# Patient Record
Sex: Male | Born: 1945 | Race: White | Hispanic: No | Marital: Married | Smoking: Never smoker
Health system: Southern US, Community
[De-identification: ages and names within clinical notes are randomized; demographics above are authoritative.]

## PROBLEM LIST (undated history)

## (undated) DIAGNOSIS — I2510 Atherosclerotic heart disease of native coronary artery without angina pectoris: Secondary | ICD-10-CM

## (undated) DIAGNOSIS — E119 Type 2 diabetes mellitus without complications: Secondary | ICD-10-CM

## (undated) DIAGNOSIS — E785 Hyperlipidemia, unspecified: Secondary | ICD-10-CM

## (undated) DIAGNOSIS — I10 Essential (primary) hypertension: Secondary | ICD-10-CM

## (undated) DIAGNOSIS — I219 Acute myocardial infarction, unspecified: Secondary | ICD-10-CM

## (undated) DIAGNOSIS — N189 Chronic kidney disease, unspecified: Secondary | ICD-10-CM

## (undated) DIAGNOSIS — I209 Angina pectoris, unspecified: Secondary | ICD-10-CM

## (undated) DIAGNOSIS — N400 Enlarged prostate without lower urinary tract symptoms: Secondary | ICD-10-CM

## (undated) HISTORY — PX: HERNIA REPAIR: SHX51

## (undated) HISTORY — PX: COLONOSCOPY: SHX174

## (undated) HISTORY — PX: STENT PLACEMENT ILIAC (ARMC HX): HXRAD1735

## (undated) HISTORY — PX: CARDIAC CATHETERIZATION: SHX172

---

## 2006-03-24 ENCOUNTER — Ambulatory Visit: Payer: Self-pay | Admitting: Unknown Physician Specialty

## 2009-03-24 ENCOUNTER — Inpatient Hospital Stay: Payer: Self-pay | Admitting: Internal Medicine

## 2009-04-25 ENCOUNTER — Encounter: Payer: Self-pay | Admitting: Internal Medicine

## 2009-05-13 ENCOUNTER — Encounter: Payer: Self-pay | Admitting: Internal Medicine

## 2009-06-13 ENCOUNTER — Encounter: Payer: Self-pay | Admitting: Internal Medicine

## 2010-10-30 ENCOUNTER — Ambulatory Visit: Payer: Self-pay | Admitting: Urology

## 2010-11-02 ENCOUNTER — Ambulatory Visit: Payer: Self-pay | Admitting: Urology

## 2010-11-08 ENCOUNTER — Ambulatory Visit: Payer: Self-pay | Admitting: Urology

## 2010-12-10 ENCOUNTER — Ambulatory Visit: Payer: Self-pay | Admitting: Urology

## 2011-01-02 ENCOUNTER — Ambulatory Visit: Payer: Self-pay | Admitting: Urology

## 2011-01-09 ENCOUNTER — Ambulatory Visit: Payer: Self-pay | Admitting: Urology

## 2011-06-17 ENCOUNTER — Ambulatory Visit: Payer: Self-pay | Admitting: Internal Medicine

## 2012-02-01 IMAGING — CR DG ABDOMEN 1V
1 series · 1 of 1 positions shown · non-contrast
Comparison: none

REASON FOR EXAM: Nephrolithiasis
COMMENTS:

[view not recorded]
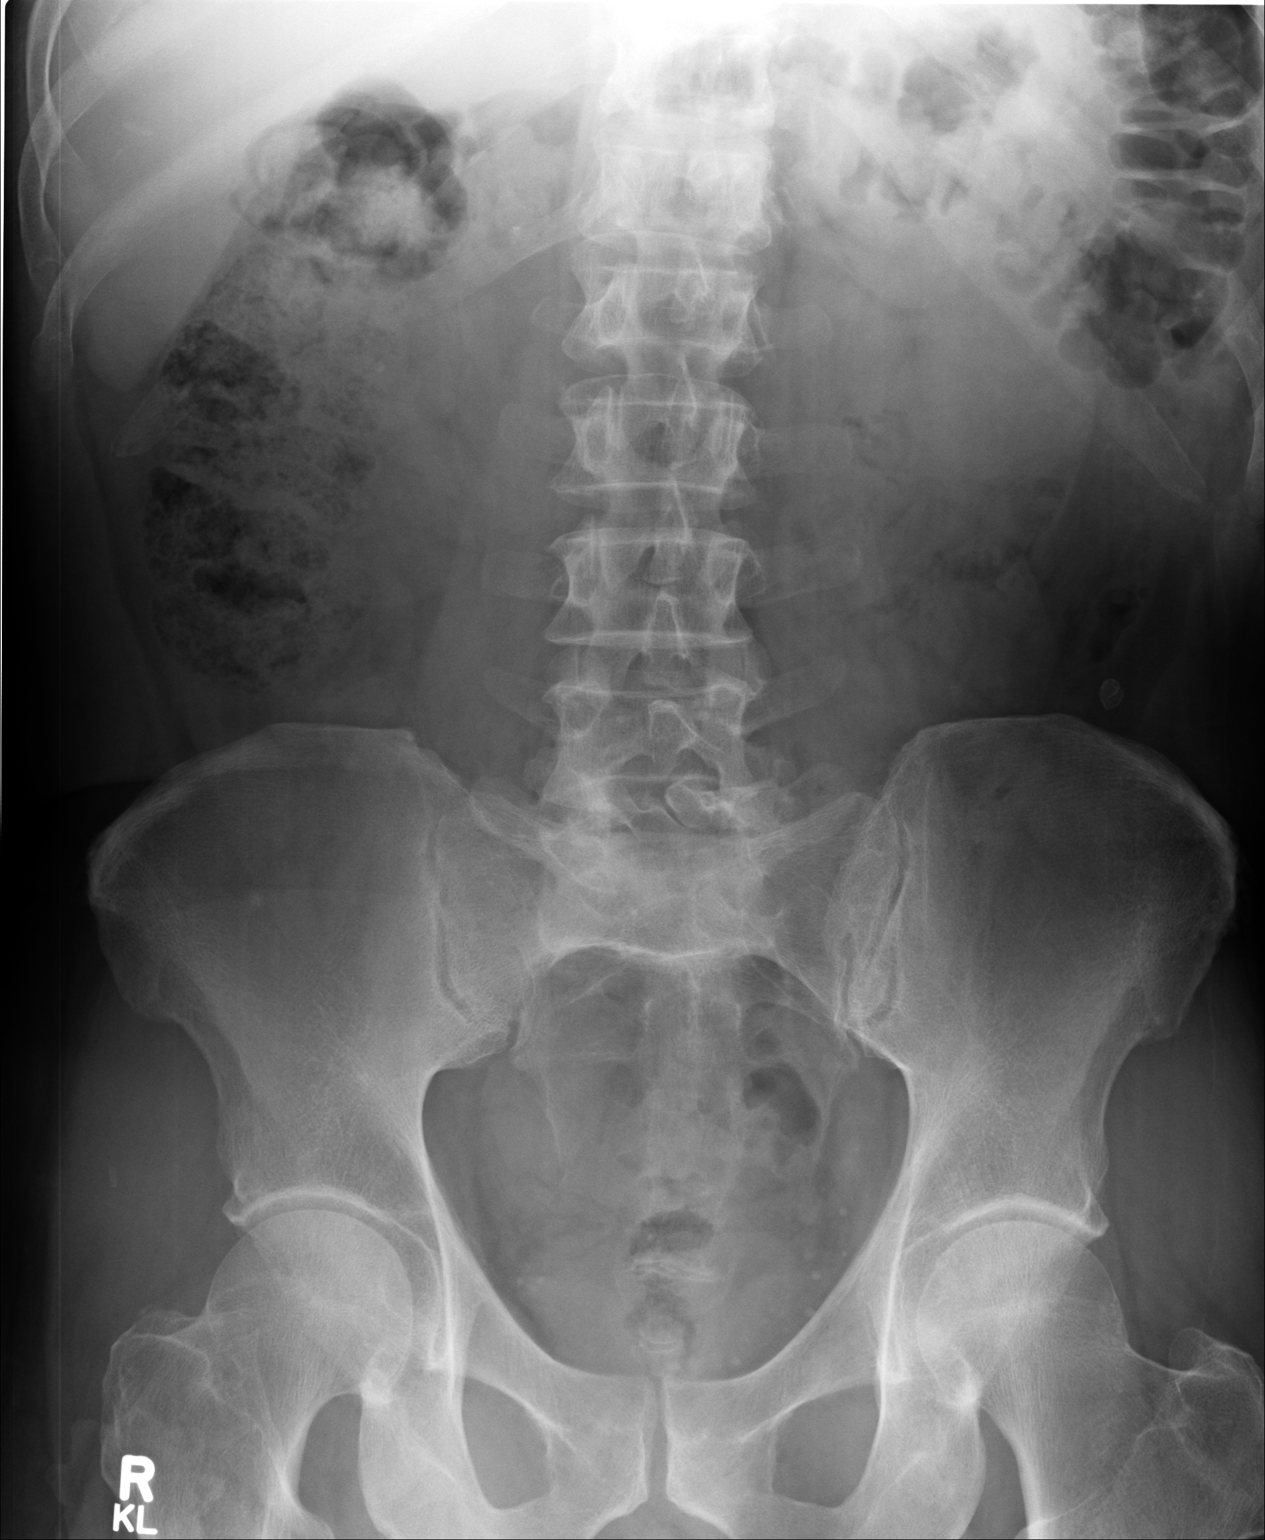

[1 of 1 positions shown; findings below may reference images not displayed]

PROCEDURE:     DXR - DXR KIDNEY URETER BLADDER  - January 02, 2011  [DATE]

RESULT:     Comparison is made to the prior exam of 12/10/2010. No definite
renal or ureteral calcifications are identified. Phleboliths are noted in
the pelvis bilaterally. The small right renal stone observed previously at
CT is not definitely seen, but may be present and obscured by overlying
bowel and bowel content.
IMPRESSION: 1.     No definite renal or ureteral calcifications are identified.

## 2012-02-08 IMAGING — US US RENAL KIDNEY
1 series · 17 of 25 positions shown · non-contrast
Comparison: none

REASON FOR EXAM: ureteral calculus and hydronephrosis
COMMENTS:

[Series 1: us renal kidney · 17 of 35 slices shown]
[im 1/35]
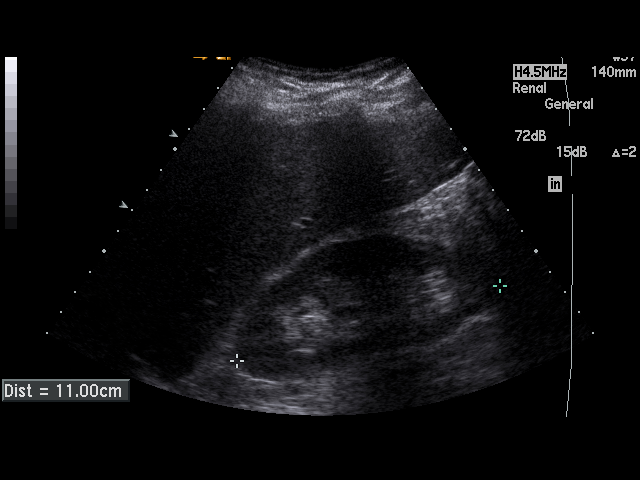
[im 3/35]
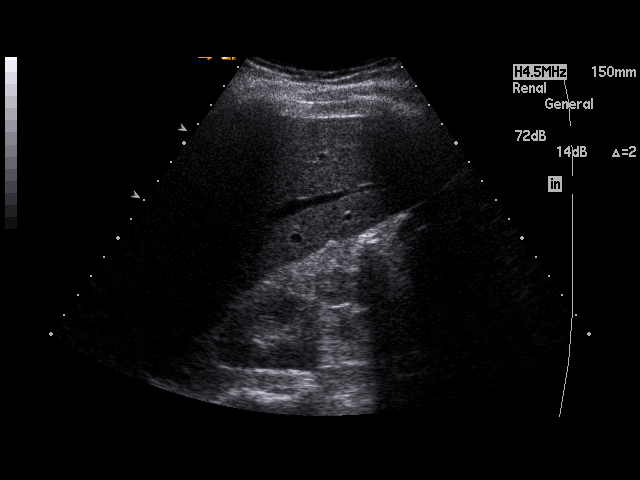
[im 5/35]
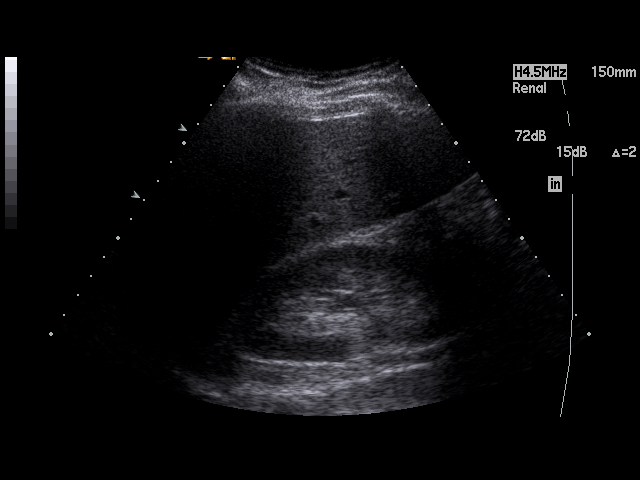
[im 8/35]
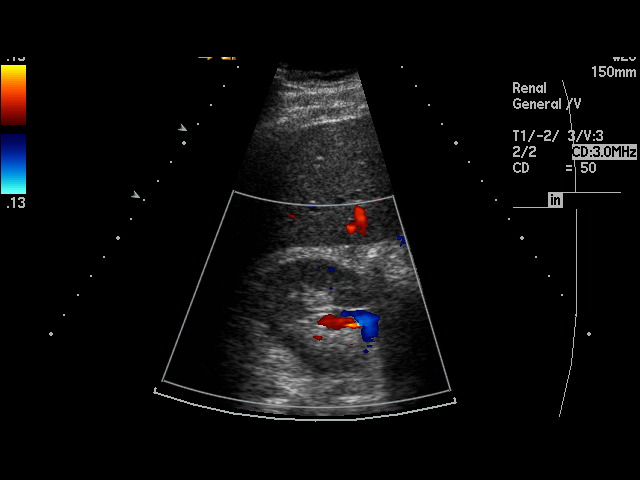
[im 9/35]
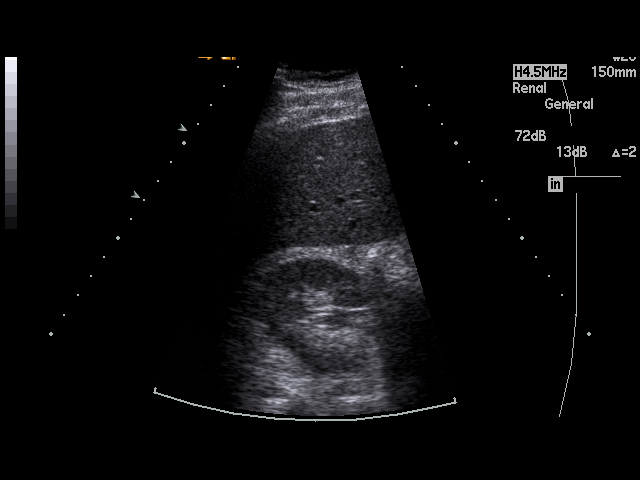
[im 12/35]
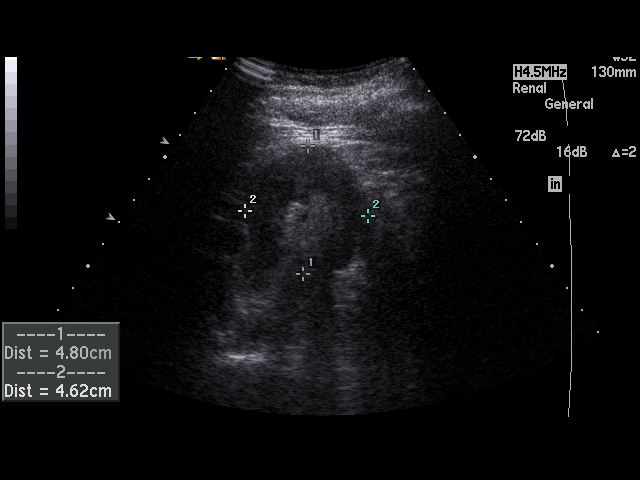
[im 13/35]
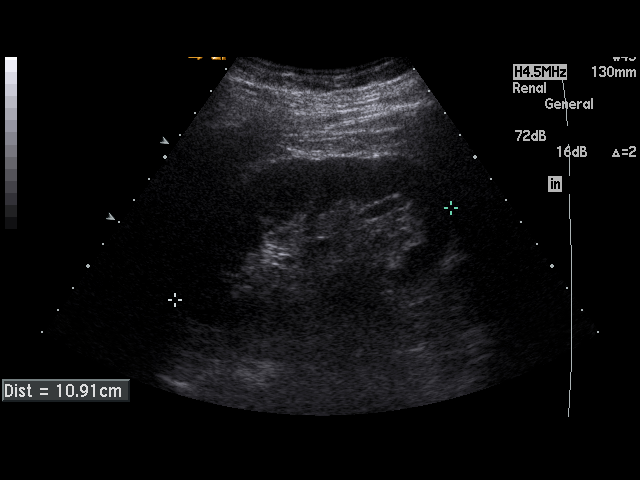
[im 16/35]
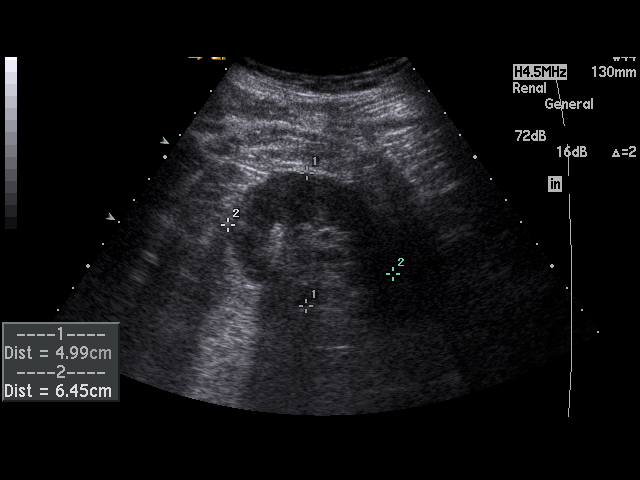
[im 18/35]
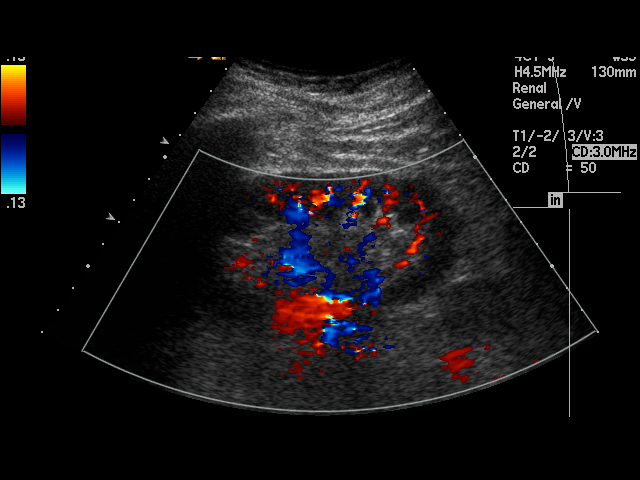
[im 19/35]
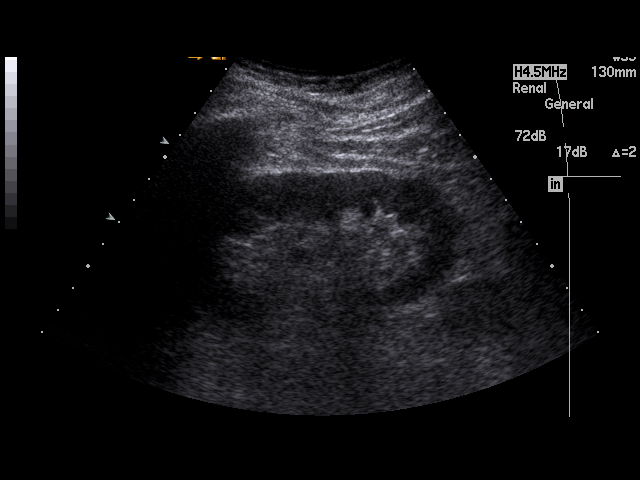
[im 22/35]
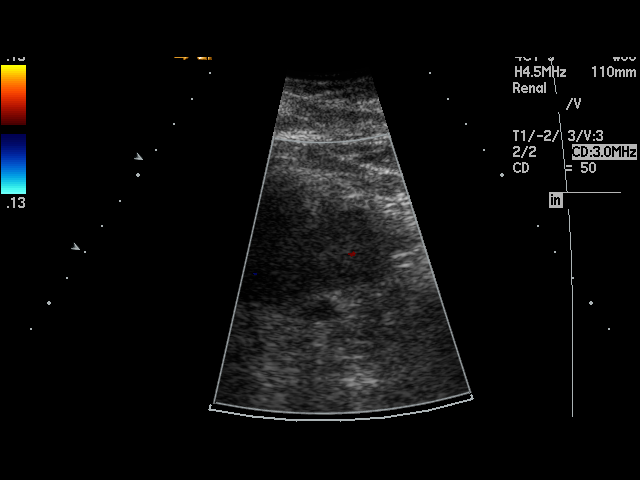
[im 23/35]
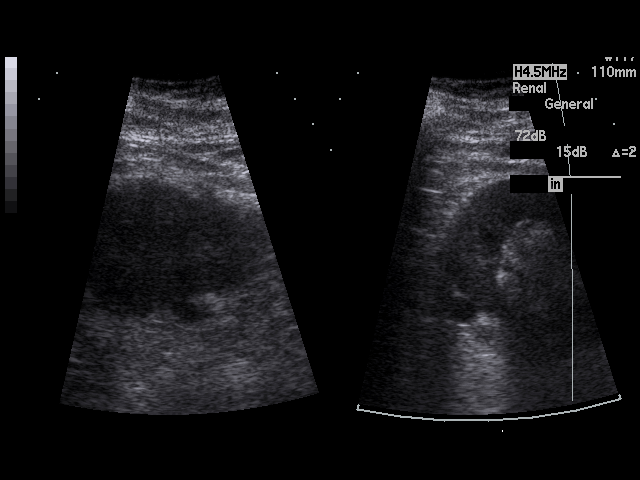
[im 26/35]
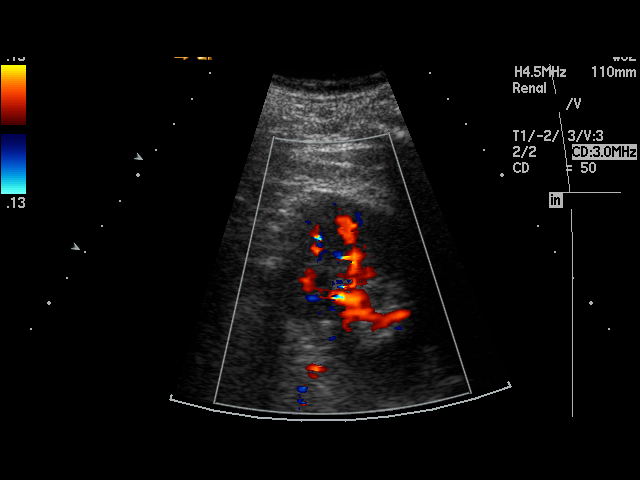
[im 27/35]
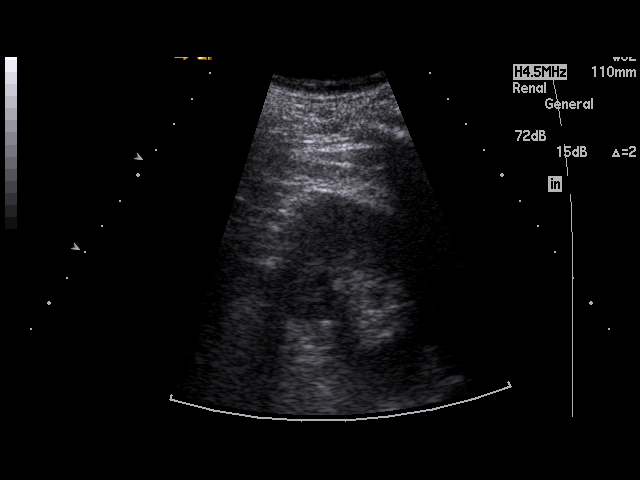
[im 30/35]
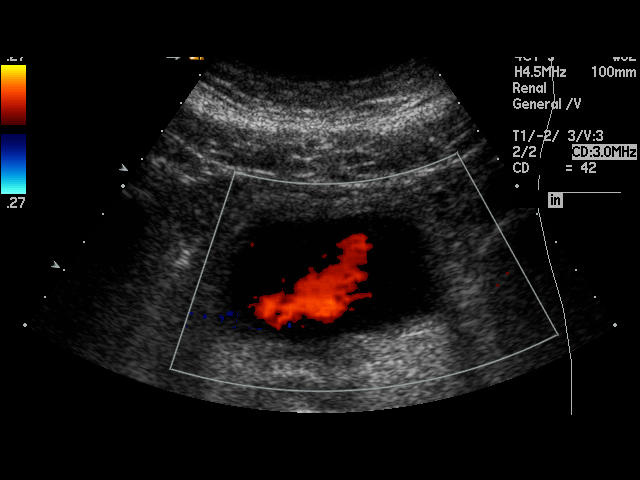
[im 32/35]
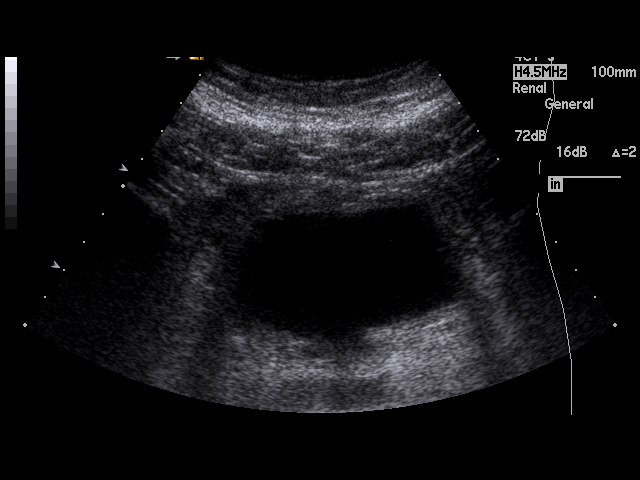
[im 35/35]
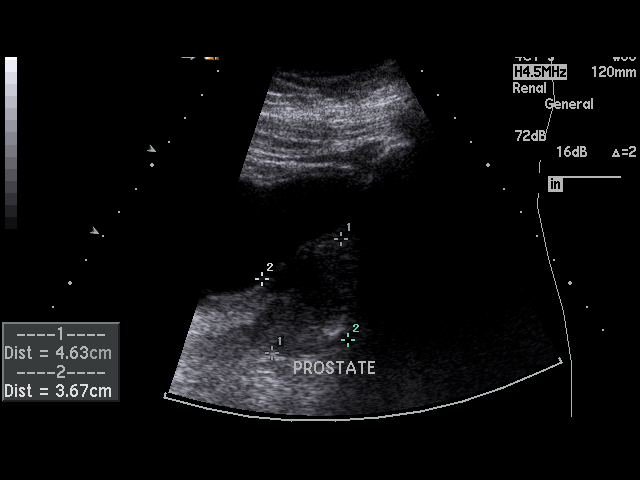

[17 of 25 positions shown; findings below may reference images not displayed]

PROCEDURE:     US  - US KIDNEY  - January 09, 2011  [DATE]

RESULT:     The right kidney measures 11.0 cm x 5.09 cm x 4.59 cm and the
left kidney measures 12.0 cm x 4.99 cm x 6.45 cm. The renal cortical margins
bilaterally are smooth. No solid renal mass lesions are seen. There is a
1.01 cm cyst of the left kidney. No renal calcifications are identified.
There is no hydronephrosis. The visualized portion of the urinary bladder is
normal in appearance. Bilateral ureteral flow jets are seen.
IMPRESSION: Normal study except for an incidentally noted left renal cyst.

## 2016-05-14 ENCOUNTER — Encounter: Payer: Self-pay | Admitting: *Deleted

## 2016-05-15 ENCOUNTER — Ambulatory Visit
Admission: RE | Admit: 2016-05-15 | Discharge: 2016-05-15 | Disposition: A | Discharge status: Home / Self Care | Payer: Medicare Other | Source: Ambulatory Visit | Attending: Unknown Physician Specialty | Admitting: Unknown Physician Specialty

## 2016-05-15 ENCOUNTER — Ambulatory Visit: Payer: Medicare Other | Admitting: Certified Registered"

## 2016-05-15 ENCOUNTER — Encounter
Admission: RE | Disposition: A | Discharge status: Home / Self Care | Payer: Self-pay | Source: Ambulatory Visit | Attending: Unknown Physician Specialty

## 2016-05-15 ENCOUNTER — Encounter: Payer: Self-pay | Admitting: *Deleted

## 2016-05-15 DIAGNOSIS — Z7982 Long term (current) use of aspirin: Secondary | ICD-10-CM | POA: Diagnosis not present

## 2016-05-15 DIAGNOSIS — K5730 Diverticulosis of large intestine without perforation or abscess without bleeding: Secondary | ICD-10-CM | POA: Insufficient documentation

## 2016-05-15 DIAGNOSIS — N400 Enlarged prostate without lower urinary tract symptoms: Secondary | ICD-10-CM | POA: Insufficient documentation

## 2016-05-15 DIAGNOSIS — E785 Hyperlipidemia, unspecified: Secondary | ICD-10-CM | POA: Diagnosis not present

## 2016-05-15 DIAGNOSIS — I252 Old myocardial infarction: Secondary | ICD-10-CM | POA: Diagnosis not present

## 2016-05-15 DIAGNOSIS — N189 Chronic kidney disease, unspecified: Secondary | ICD-10-CM | POA: Insufficient documentation

## 2016-05-15 DIAGNOSIS — Z79899 Other long term (current) drug therapy: Secondary | ICD-10-CM | POA: Insufficient documentation

## 2016-05-15 DIAGNOSIS — E1122 Type 2 diabetes mellitus with diabetic chronic kidney disease: Secondary | ICD-10-CM | POA: Insufficient documentation

## 2016-05-15 DIAGNOSIS — I129 Hypertensive chronic kidney disease with stage 1 through stage 4 chronic kidney disease, or unspecified chronic kidney disease: Secondary | ICD-10-CM | POA: Diagnosis not present

## 2016-05-15 DIAGNOSIS — I2510 Atherosclerotic heart disease of native coronary artery without angina pectoris: Secondary | ICD-10-CM | POA: Insufficient documentation

## 2016-05-15 DIAGNOSIS — Z1211 Encounter for screening for malignant neoplasm of colon: Secondary | ICD-10-CM | POA: Diagnosis present

## 2016-05-15 DIAGNOSIS — K6389 Other specified diseases of intestine: Secondary | ICD-10-CM | POA: Diagnosis not present

## 2016-05-15 DIAGNOSIS — K640 First degree hemorrhoids: Secondary | ICD-10-CM | POA: Insufficient documentation

## 2016-05-15 HISTORY — DX: Benign prostatic hyperplasia without lower urinary tract symptoms: N40.0

## 2016-05-15 HISTORY — DX: Hyperlipidemia, unspecified: E78.5

## 2016-05-15 HISTORY — DX: Angina pectoris, unspecified: I20.9

## 2016-05-15 HISTORY — DX: Acute myocardial infarction, unspecified: I21.9

## 2016-05-15 HISTORY — DX: Type 2 diabetes mellitus without complications: E11.9

## 2016-05-15 HISTORY — DX: Chronic kidney disease, unspecified: N18.9

## 2016-05-15 HISTORY — DX: Atherosclerotic heart disease of native coronary artery without angina pectoris: I25.10

## 2016-05-15 HISTORY — DX: Essential (primary) hypertension: I10

## 2016-05-15 HISTORY — PX: COLONOSCOPY WITH PROPOFOL: SHX5780

## 2016-05-15 LAB — GLUCOSE, CAPILLARY: Glucose-Capillary: 151 mg/dL — ABNORMAL HIGH (ref 65–99)

## 2016-05-15 SURGERY — COLONOSCOPY WITH PROPOFOL
Anesthesia: General

## 2016-05-15 MED ORDER — METOPROLOL SUCCINATE ER 25 MG PO TB24
12.5000 mg | ORAL_TABLET | ORAL | Status: AC
  Administered 2016-05-15: 12.5 mg via ORAL

## 2016-05-15 MED ORDER — METOPROLOL SUCCINATE ER 25 MG PO TB24
ORAL_TABLET | ORAL | Status: AC
  Filled 2016-05-15: qty 1

## 2016-05-15 MED ORDER — SODIUM CHLORIDE 0.9 % IV SOLN
INTRAVENOUS | Status: DC
  Administered 2016-05-15: 08:00:00 via INTRAVENOUS

## 2016-05-15 MED ORDER — PROPOFOL 10 MG/ML IV BOLUS
INTRAVENOUS | Status: DC | PRN
  Administered 2016-05-15: 40 mg via INTRAVENOUS

## 2016-05-15 MED ORDER — PROPOFOL 500 MG/50ML IV EMUL
INTRAVENOUS | Status: AC
  Filled 2016-05-15: qty 100

## 2016-05-15 MED ORDER — LIDOCAINE 2% (20 MG/ML) 5 ML SYRINGE
INTRAMUSCULAR | Status: AC
  Filled 2016-05-15: qty 5

## 2016-05-15 MED ORDER — SODIUM CHLORIDE 0.9 % IV SOLN
INTRAVENOUS | Status: DC
  Administered 2016-05-15: 1000 mL via INTRAVENOUS

## 2016-05-15 MED ORDER — PHENYLEPHRINE 40 MCG/ML (10ML) SYRINGE FOR IV PUSH (FOR BLOOD PRESSURE SUPPORT)
PREFILLED_SYRINGE | INTRAVENOUS | Status: AC
  Filled 2016-05-15: qty 10

## 2016-05-15 MED ORDER — PHENYLEPHRINE HCL 10 MG/ML IJ SOLN
INTRAMUSCULAR | Status: DC | PRN
  Administered 2016-05-15: 80 ug via INTRAVENOUS

## 2016-05-15 MED ORDER — LIDOCAINE HCL (CARDIAC) 20 MG/ML IV SOLN
INTRAVENOUS | Status: DC | PRN
  Administered 2016-05-15: 100 mg via INTRAVENOUS

## 2016-05-15 MED ORDER — PROPOFOL 500 MG/50ML IV EMUL
INTRAVENOUS | Status: DC | PRN
  Administered 2016-05-15: 150 ug/kg/min via INTRAVENOUS

## 2016-05-15 MED ORDER — PROPOFOL 10 MG/ML IV BOLUS
INTRAVENOUS | Status: AC
  Filled 2016-05-15: qty 20

## 2016-05-15 NOTE — Op Note (Signed)
[facility] Gastroenterology Patient Name: Colton EvaKenneth Oshel Procedure Date: 05/15/2016 7:29 AM MRN: [id number] Account #: [id number] Date of Birth: Jun 18, 1945 Admit Type: Outpatient Age: [age] Room: [facility] END[facility] ROOM 4 Gender: Male Note Status: Finalized Procedure:            Colonoscopy Indications:          Screening for colorectal malignant neoplasm Providers:            Scot Junobert T. Elliott, MD Referring MD:         Duane LopeJeffrey D. Judithann SheenSparks, MD (Referring MD) Medicines:            Propofol per Anesthesia Complications:        No immediate complications. Procedure:            Pre-Anesthesia Assessment:                       - After reviewing the risks and benefits, the patient                        was deemed in satisfactory condition to undergo the                        procedure.                       After obtaining informed consent, the colonoscope was                        passed under direct vision. Throughout the procedure,                        the patient's blood pressure, pulse, and oxygen                        saturations were monitored continuously. The                        Colonoscope was introduced through the anus and                        advanced to the the cecum, identified by appendiceal                        orifice and ileocecal valve. The colonoscopy was                        performed without difficulty. The patient tolerated the                        procedure well. The quality of the bowel preparation                        was excellent. Findings:      A small polyp was found in the ascending colon. The polyp was sessile.       The polyp was removed with a jumbo cold forceps. Resection and retrieval       were complete.      A few small-mouthed diverticula were found in the sigmoid colon and       transverse colon.      Internal hemorrhoids were found during endoscopy. The hemorrhoids  were       small and Grade I (internal hemorrhoids that  do not prolapse).      The exam was otherwise without abnormality. Impression:           - One small polyp in the ascending colon, removed with                        a jumbo cold forceps. Resected and retrieved.                       - Diverticulosis in the sigmoid colon and in the                        transverse colon.                       - Internal hemorrhoids.                       - The examination was otherwise normal. Recommendation:       - Await pathology results. Resume all medications                       - Advance diet as tolerated. Scot Jun, MD 05/15/2016 8:02:13 AM This report has been signed electronically. Number of Addenda: 0 Note Initiated On: 05/15/2016 7:29 AM Scope Withdrawal Time: 0 hours 6 minutes 49 seconds  Total Procedure Duration: 0 hours 12 minutes 20 seconds       [facility]

## 2016-05-15 NOTE — Anesthesia Postprocedure Evaluation (Signed)
Anesthesia Post Note  Patient: Colton PhoKenneth D Maddocks  Procedure(s) Performed: Procedure(s) (LRB): COLONOSCOPY WITH PROPOFOL (N/A)  Patient location during evaluation: Endoscopy Anesthesia Type: General Level of consciousness: awake and alert Pain management: pain level controlled Vital Signs Assessment: post-procedure vital signs reviewed and stable Respiratory status: spontaneous breathing, nonlabored ventilation, respiratory function stable and patient connected to nasal cannula oxygen Cardiovascular status: blood pressure returned to baseline and stable Postop Assessment: no signs of nausea or vomiting Anesthetic complications: no     Last Vitals:  Vitals:   05/15/16 0825 05/15/16 0834  BP: 125/83 124/81  Pulse: 78 73  Resp: 18 15  Temp:      Last Pain:  Vitals:   05/15/16 0805  TempSrc: Tympanic                 Cleda MccreedyJoseph K Rosealie Reach

## 2016-05-15 NOTE — H&P (Signed)
   Primary Care Physician:  Marguarite ArbourSPARKS,JEFFREY D, MD Primary Gastroenterologist:  Dr. Mechele CollinElliott  Pre-Procedure History & Physical: HPI:  Colton Hamilton Meiring is a 71 y.o. male is here for an colonoscopy.   Past Medical History:  Diagnosis Date  . Anginal pain (HCC)   . BPH (benign prostatic hyperplasia)   . Chronic kidney disease    nephrolithiasis  . Coronary artery disease   . Diabetes mellitus without complication (HCC)   . Hyperlipidemia   . Hypertension   . Myocardial infarction     Past Surgical History:  Procedure Laterality Date  . CARDIAC CATHETERIZATION    . COLONOSCOPY    . HERNIA REPAIR    . STENT PLACEMENT ILIAC ([facility] HX)      Prior to Admission medications   Medication Sig Start Date End Date Taking? Authorizing Provider  aspirin EC 81 MG tablet Take 81 mg by mouth daily.   Yes Historical Provider, MD  atorvastatin (LIPITOR) 10 MG tablet Take 10 mg by mouth daily.   Yes Historical Provider, MD  metoprolol succinate (TOPROL-XL) 25 MG 24 hr tablet Take 12.5 mg by mouth daily.   Yes Historical Provider, MD  tadalafil (CIALIS) 10 MG tablet Take 10 mg by mouth daily as needed for erectile dysfunction.   Yes Historical Provider, MD    Allergies as of 05/02/2016  . (Not on File)    History reviewed. No pertinent family history.  Social History   Social History  . Marital status: Married    Spouse name: N/A  . Number of children: N/A  . Years of education: N/A   Occupational History  . Not on file.   Social History Main Topics  . Smoking status: Never Smoker  . Smokeless tobacco: Current User  . Alcohol use Yes  . Drug use: No  . Sexual activity: Not on file   Other Topics Concern  . Not on file   Social History Narrative  . No narrative on file    Review of Systems: See HPI, otherwise negative ROS  Physical Exam: BP 130/88   Pulse (!) 115   Temp (!) 96.6 F (35.9 C) (Tympanic)   Resp 20   Ht 5\' 7"  (1.702 m)   Wt 86.2 kg (190 lb)   SpO2 96%    BMI 29.76 kg/m  General:   Alert,  pleasant and cooperative in NAD Head:  Normocephalic and atraumatic. Neck:  Supple; no masses or thyromegaly. Lungs:  Clear throughout to auscultation.    Heart:  Regular rate and rhythm. Abdomen:  Soft, nontender and nondistended. Normal bowel sounds, without guarding, and without rebound.   Neurologic:  Alert and  oriented x4;  grossly normal neurologically.  Impression/Plan: Colton Hamilton Mattern is here for an colonoscopy to be performed for screening colonoscopy  Risks, benefits, limitations, and alternatives regarding  colonoscopy have been reviewed with the patient.  Questions have been answered.  All parties agreeable.   Lynnae PrudeELLIOTT, Curlie Macken, MD  05/15/2016, 7:27 AM

## 2016-05-15 NOTE — Transfer of Care (Signed)
Immediate Anesthesia Transfer of Care Note  Patient: Colton PhoKenneth D Hamilton  Procedure(s) Performed: Procedure(s): COLONOSCOPY WITH PROPOFOL (N/A)  Patient Location: Endoscopy Unit  Anesthesia Type:General  Level of Consciousness: awake, oriented and patient cooperative  Airway & Oxygen Therapy: Patient Spontanous Breathing and Patient connected to nasal cannula oxygen  Post-op Assessment: Report given to RN, Post -op Vital signs reviewed and stable and Patient moving all extremities X 4  Post vital signs: Reviewed and stable  Last Vitals:  Vitals:   05/15/16 0710  BP: 130/88  Pulse: (!) 115  Resp: 20  Temp: (!) 35.9 C    Last Pain:  Vitals:   05/15/16 0710  TempSrc: Tympanic         Complications: No apparent anesthesia complications

## 2016-05-15 NOTE — Anesthesia Preprocedure Evaluation (Signed)
Anesthesia Evaluation  Patient identified by MRN, date of birth, ID band Patient awake    Reviewed: Allergy & Precautions, H&P , NPO status , Patient's Chart, lab work & pertinent test results  History of Anesthesia Complications Negative for: history of anesthetic complications  Airway Mallampati: III  TM Distance: <3 FB Neck ROM: limited    Dental  (+) Poor Dentition, Chipped, Caps   Pulmonary neg pulmonary ROS, neg shortness of breath,    Pulmonary exam normal breath sounds clear to auscultation       Cardiovascular Exercise Tolerance: Good hypertension, (-) angina+ CAD, + Past MI and + Cardiac Stents  (-) DOE Normal cardiovascular exam Rhythm:regular Rate:Normal     Neuro/Psych negative neurological ROS  negative psych ROS   GI/Hepatic negative GI ROS, Neg liver ROS,   Endo/Other  diabetes, Type 2  Renal/GU Renal disease  negative genitourinary   Musculoskeletal   Abdominal   Peds  Hematology negative hematology ROS (+)   Anesthesia Other Findings Past Medical History: No date: Anginal pain (HCC) No date: BPH (benign prostatic hyperplasia) No date: Chronic kidney disease     Comment: nephrolithiasis No date: Coronary artery disease No date: Diabetes mellitus without complication (HCC) No date: Hyperlipidemia No date: Hypertension No date: Myocardial infarction  Past Surgical History: No date: CARDIAC CATHETERIZATION No date: COLONOSCOPY No date: HERNIA REPAIR No date: STENT PLACEMENT ILIAC ([facility] HX)     Reproductive/Obstetrics negative OB ROS                             Anesthesia Physical Anesthesia Plan  ASA: III  Anesthesia Plan: General   Post-op Pain Management:    Induction:   Airway Management Planned:   Additional Equipment:   Intra-op Plan:   Post-operative Plan:   Informed Consent: I have reviewed the patients History and Physical, chart, labs  and discussed the procedure including the risks, benefits and alternatives for the proposed anesthesia with the patient or authorized representative who has indicated his/her understanding and acceptance.   Dental Advisory Given  Plan Discussed with: Anesthesiologist, CRNA and Surgeon  Anesthesia Plan Comments:         Anesthesia Quick Evaluation

## 2016-05-16 ENCOUNTER — Encounter: Payer: Self-pay | Admitting: Unknown Physician Specialty

## 2016-05-17 LAB — SURGICAL PATHOLOGY
